# Patient Record
Sex: Female | Born: 2005 | Race: White | Hispanic: No | Marital: Single | Smoking: Current every day smoker
Health system: Southern US, Community
[De-identification: ages and names within clinical notes are randomized; demographics above are authoritative.]

## PROBLEM LIST (undated history)

## (undated) DIAGNOSIS — F909 Attention-deficit hyperactivity disorder, unspecified type: Secondary | ICD-10-CM

## (undated) DIAGNOSIS — F419 Anxiety disorder, unspecified: Secondary | ICD-10-CM

## (undated) DIAGNOSIS — F958 Other tic disorders: Secondary | ICD-10-CM

## (undated) DIAGNOSIS — R109 Unspecified abdominal pain: Secondary | ICD-10-CM

## (undated) DIAGNOSIS — F429 Obsessive-compulsive disorder, unspecified: Secondary | ICD-10-CM

## (undated) HISTORY — DX: Obsessive-compulsive disorder, unspecified: F42.9

## (undated) HISTORY — DX: Attention-deficit hyperactivity disorder, unspecified type: F90.9

## (undated) HISTORY — DX: Unspecified abdominal pain: R10.9

## (undated) HISTORY — DX: Anxiety disorder, unspecified: F41.9

## (undated) HISTORY — DX: Other tic disorders: F95.8

---
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
## 2023-03-24 NOTE — Progress Notes (Unsigned)
No chief complaint on file.  Patient is requesting to be screened for anemia. She has been feeling lightheaded and tired.   Her sister has h/o anemia with similar symptoms.  She hasn't been seen by me since her physical in 2022. CBC, lipids, chem and thyroid were all normal at that time. Vitamin D Deficiency was noted, with level of 16.9. She was treated with prescription weekly vitamin D, and was encouraged to take 1000 IU D3 daily, longterm, upon completion of the prescription.  She has had some sinus infections earlier this year (February and May 2024).   Cycles--regular? Heavy?  She has h/o dysmenorrhea, acne, PMDD, anxiety, OCD.  Under care of psych.  She was started on Yaz in 2022, only took x 4d, woke up with nightmare about throwing up, so stopped taking it. We had also discussed taking naproxen prior to cycles to help with pain.     PMH, PSH, SH reviewed   ROS:    PHYSICAL EXAM:  There were no vitals taken for this visit.      ASSESSMENT/PLAN:   Cbc, ferritin, D Consider TSH, c-met If sexually active, should also have STD check  Flu/COVID vaccines can be offered  Should schedule CPE

## 2023-03-25 ENCOUNTER — Ambulatory Visit: Payer: 59 | Admitting: Family Medicine

## 2023-03-25 ENCOUNTER — Encounter: Payer: Self-pay | Admitting: Family Medicine

## 2023-03-25 VITALS — BP 110/68 | HR 84 | Ht 69.0 in | Wt 128.4 lb

## 2023-03-25 DIAGNOSIS — R5383 Other fatigue: Secondary | ICD-10-CM

## 2023-03-25 DIAGNOSIS — F339 Major depressive disorder, recurrent, unspecified: Secondary | ICD-10-CM | POA: Diagnosis not present

## 2023-03-25 DIAGNOSIS — R42 Dizziness and giddiness: Secondary | ICD-10-CM | POA: Diagnosis not present

## 2023-03-25 DIAGNOSIS — Z8349 Family history of other endocrine, nutritional and metabolic diseases: Secondary | ICD-10-CM | POA: Diagnosis not present

## 2023-03-25 DIAGNOSIS — E559 Vitamin D deficiency, unspecified: Secondary | ICD-10-CM

## 2023-03-25 NOTE — Patient Instructions (Signed)
Try and eat more fruits and vegetables ( a high fiber diet)--this can help with moods, energy and constipation.  I suspect your vitamin D will be low, and we will send in another prescription.  Once you finish the prescription, it is very important that you take a daily supplement, long-term (D3 1000 IU daily, vs a multivitamin that contains the same amount).  Please stay well hydrated. Try and drink at least 6-8 glasses of water daily (goal is to have very pale yellow/clear urine--the more yellow it is, the more water you need to drink).  I agree with your plans to see the psychiatrist, as depression is likely contributing to some of your fatigue.  We will be in touch with your lab results within the next 1-2 days.  Please try and sign up for MyChart if you'd like the results sooner, and for easier ways to communicate with the office (sending messages with questions, rather than calling).

## 2023-03-26 ENCOUNTER — Encounter: Payer: Self-pay | Admitting: Family Medicine

## 2023-03-26 LAB — VITAMIN D 25 HYDROXY (VIT D DEFICIENCY, FRACTURES): Vit D, 25-Hydroxy: 11.8 ng/mL — ABNORMAL LOW (ref 30.0–100.0)

## 2023-03-26 LAB — CBC WITH DIFFERENTIAL/PLATELET
Basophils Absolute: 0 10*3/uL (ref 0.0–0.2)
Basos: 1 %
EOS (ABSOLUTE): 0.1 10*3/uL (ref 0.0–0.4)
Eos: 2 %
Hematocrit: 42.9 % (ref 34.0–46.6)
Hemoglobin: 13.8 g/dL (ref 11.1–15.9)
Immature Grans (Abs): 0 10*3/uL (ref 0.0–0.1)
Immature Granulocytes: 0 %
Lymphocytes Absolute: 2.2 10*3/uL (ref 0.7–3.1)
Lymphs: 36 %
MCH: 28.2 pg (ref 26.6–33.0)
MCHC: 32.2 g/dL (ref 31.5–35.7)
MCV: 88 fL (ref 79–97)
Monocytes Absolute: 0.5 10*3/uL (ref 0.1–0.9)
Monocytes: 7 %
Neutrophils Absolute: 3.3 10*3/uL (ref 1.4–7.0)
Neutrophils: 54 %
Platelets: 315 10*3/uL (ref 150–450)
RBC: 4.89 x10E6/uL (ref 3.77–5.28)
RDW: 12.7 % (ref 11.7–15.4)
WBC: 6.1 10*3/uL (ref 3.4–10.8)

## 2023-03-26 LAB — FERRITIN: Ferritin: 25 ng/mL (ref 15–77)

## 2023-03-26 LAB — TSH: TSH: 1.5 u[IU]/mL (ref 0.450–4.500)

## 2023-03-26 MED ORDER — VITAMIN D (ERGOCALCIFEROL) 1.25 MG (50000 UNIT) PO CAPS: 50000.0000 [IU] | ORAL_CAPSULE | ORAL | 0 refills | Status: AC

## 2023-06-15 ENCOUNTER — Other Ambulatory Visit: Payer: Self-pay | Admitting: Family Medicine

## 2023-06-15 DIAGNOSIS — E559 Vitamin D deficiency, unspecified: Secondary | ICD-10-CM

## 2023-11-24 ENCOUNTER — Telehealth: Admitting: Physician Assistant

## 2023-11-24 DIAGNOSIS — K5900 Constipation, unspecified: Secondary | ICD-10-CM | POA: Diagnosis not present

## 2023-11-24 MED ORDER — ONDANSETRON HCL 4 MG PO TABS: 4.0000 mg | ORAL_TABLET | Freq: Three times a day (TID) | ORAL | 0 refills | Status: AC | PRN

## 2023-11-24 NOTE — Patient Instructions (Signed)
  Shiniqua Gengler, thank you for joining Harlene PEDLAR Ward, PA-C for today's virtual visit.  While this provider is not your primary care provider (PCP), if your PCP is located in our provider database this encounter information will be shared with them immediately following your visit.   A [facility] MyChart account gives you access to today's visit and all your visits, tests, and labs performed at [facility]  click here if you don't have a [facility] MyChart account or go to [email or web address]  Consent: (Patient) Anne [name] ([place] provided verbal consent for this virtual visit at the beginning of the encounter.  Current Medications:  Current Outpatient Medications:    ondansetron  (ZOFRAN ) 4 MG tablet, Take 4 mg by mouth every 8 (eight) hours as needed for nausea or vomiting. (Patient not taking: Reported on 03/25/2023), Disp: , Rfl:    SRONYX 0.1-20 MG-MCG tablet, Take 1 tablet by mouth daily., Disp: , Rfl:    Vitamin D , Ergocalciferol , (DRISDOL ) 1.25 MG (50000 UNIT) CAPS capsule, Take 1 capsule (50,000 Units total) by mouth every 7 (seven) days., Disp: 12 capsule, Rfl: 0   Medications ordered in this encounter:  No orders of the defined types were placed in this encounter.    *If you need refills on other medications prior to your next appointment, please contact your pharmacy*  Follow-Up: Call back or seek an in-person evaluation if the symptoms worsen or if the condition fails to improve as anticipated.  [facility] [phone]  Other Instructions Recommend 1 scoop or capful of Miralax  in juice daily, can increase to twice per day if no improvement.   Can take Senakot 1-2 tablets.   Drink plenty of fluids.   Can continue with fiber gummies.   If no improvement recommend in person evaluation.  Recommend follow up with Primary Care Physician regardless since this is an ongoing problem.    If you have been instructed to have an in-person  evaluation today at a local Urgent Care facility, please use the link below. It will take you to a list of all of our available [facility], including address, phone number and hours of operation. Please do not delay care.  [facility]  If you or a family member do not have a primary care provider, use the link below to schedule a visit and establish care. When you choose a [facility] primary care physician or advanced practice provider, you gain a long-term partner in health. Find a Primary Care Provider  Learn more about [facility]'s in-office and virtual care options: [facility] - Get Care Now

## 2023-11-24 NOTE — Progress Notes (Signed)
 Virtual Visit Consent   Anne [name], you are scheduled for a virtual visit with a [facility] provider today. Just as with appointments in the office, your consent must be obtained to participate. Your consent will be active for this visit and any virtual visit you may have with one of our providers in the next 365 days. If you have a MyChart account, a copy of this consent can be sent to you electronically.  As this is a virtual visit, video technology does not allow for your provider to perform a traditional examination. This may limit your provider's ability to fully assess your condition. If your provider identifies any concerns that need to be evaluated in person or the need to arrange testing (such as labs, EKG, etc.), we will make arrangements to do so. Although advances in technology are sophisticated, we cannot ensure that it will always work on either your end or our end. If the connection with a video visit is poor, the visit may have to be switched to a telephone visit. With either a video or telephone visit, we are not always able to ensure that we have a secure connection.  By engaging in this virtual visit, you consent to the provision of healthcare and authorize for your insurance to be billed (if applicable) for the services provided during this visit. Depending on your insurance coverage, you may receive a charge related to this service.  I need to obtain your verbal consent now. Are you willing to proceed with your visit today? Anne [name] has provided verbal consent on 11/24/2023 for a virtual visit (video or telephone). Harlene PEDLAR Ward, PA-C  Date: 11/24/2023 12:40 PM   Virtual Visit via Video Note   I, Harlene PEDLAR Ward, connected with  Anne [name]  ([id number], April 17, 2006) on 11/24/23 at 12:15 PM EDT by a video-enabled telemedicine application and verified that I am speaking with the correct person using two identifiers.  Location: Patient: Virtual Visit  Location Patient: Home Provider: Virtual Visit Location Provider: Home Office   I discussed the limitations of evaluation and management by telemedicine and the availability of in person appointments. The patient expressed understanding and agreed to proceed.    History of Present Illness: Anne [name] is a 18 y.o. who identifies as a female who was assigned female at birth, and is being seen today for constipation.  She has been experiencing this intermittently for some time.  Reports last bowel movement about 5 days ago.  She has tried Miralax  and fiber with minimal relief.  Does complain of nausea and abdominal cramping.  Denies vomiting.  Drinking fluids.  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 (Patient not taking: Reported on 03/25/2023), Disp: , Rfl:    SRONYX 0.1-20 MG-MCG tablet, Take 1 tablet by mouth daily., Disp: , Rfl:    Vitamin D , Ergocalciferol , (DRISDOL ) 1.25 MG (50000 UNIT) CAPS capsule, Take 1 capsule (50,000 Units total) by mouth every 7 (seven) days., Disp: 12 capsule, Rfl: 0  Observations/Objective: Patient is well-developed, well-nourished in no acute distress.  Resting comfortably  at home.  Head is normocephalic, atraumatic.  No labored breathing.  Speech is clear and coherent with logical content.  Patient is alert and oriented at baseline.    Assessment and Plan: 1. Constipation, unspecified constipation type (Primary)  Supportive care discussed.  If no improvement recommend in person evaluation.    Follow Up Instructions: I discussed the assessment and treatment plan with the patient. The patient was provided an opportunity to ask questions and all were answered. The patient agreed with the plan and demonstrated an understanding of the instructions.  A copy of instructions were sent to the patient via MyChart unless otherwise noted below.     The patient was advised to call back or seek an in-person evaluation if the symptoms worsen or if the condition fails to improve as anticipated.    Harlene PEDLAR Ward, PA-C

## 2023-11-28 ENCOUNTER — Ambulatory Visit (HOSPITAL_COMMUNITY)

## 2023-11-28 ENCOUNTER — Ambulatory Visit: Admitting: Family Medicine

## 2023-11-28 ENCOUNTER — Ambulatory Visit
Admission: RE | Admit: 2023-11-28 | Discharge: 2023-11-28 | Disposition: A | Discharge status: Home / Self Care | Source: Ambulatory Visit | Attending: Family Medicine | Admitting: Family Medicine

## 2023-11-28 VITALS — BP 122/70 | HR 111 | Wt 117.0 lb

## 2023-11-28 DIAGNOSIS — K5900 Constipation, unspecified: Secondary | ICD-10-CM

## 2023-11-28 MED ORDER — POLYETHYLENE GLYCOL 3350 17 GM/SCOOP PO POWD: 17.0000 g | Freq: Two times a day (BID) | ORAL | 1 refills | Status: AC | PRN

## 2023-11-28 NOTE — Progress Notes (Signed)
 Name: Anne [name]   Date of Visit: 11/28/23   Date of last visit with me: Visit date not found   CHIEF COMPLAINT:  Chief Complaint  Patient presents with   Acute Visit    Constipation issues. Been going on for the past 2 weeks. Took mirlax for 3 days now bowel movements are diarrhea like, has a lot of abdominal pain which is making her nausea. Also thinks she could have a conditions which is causing her to be constipation.        HPI:  Discussed the use of AI scribe software for clinical note transcription with the patient, who gave verbal consent to proceed.  History of Present Illness   Anne [name] is a 18 year old female with chronic constipation who presents with worsening constipation over the past two weeks. She is accompanied by her mother, Anne [name].  She has experienced chronic constipation with a recent exacerbation over the past two weeks, characterized by abdominal pain, nausea, and a sensation of incomplete evacuation. Bowel movements require significant straining, resulting in small, hard stools, and she often feels unable to completely evacuate her bowels.  She has been taking Miralax  daily for the past four days, which has led to watery stools. Prior to this, her stools were very small and hard. She has not used any other laxatives or fiber supplements like Metamucil. Her fluid intake is limited to two or three 8-ounce bottles of water per day. She acknowledges a poor diet and gets full quickly, which may contribute to her constipation.  She is currently taking Diannavel, which she noted can cause constipation, but she only takes a half dose. During the review of symptoms, she reported bloating and a 'bubbling' sensation in her gut. She also mentioned a fear of nausea due to a fear of vomiting, which has led to panic attacks. She notes a firm feeling in her abdomen.         OBJECTIVE:       03/25/2023    4:24 PM  Depression screen PHQ 2/9  Decreased  Interest 2  Down, Depressed, Hopeless 2  PHQ - 2 Score 4  Altered sleeping 2  Change in appetite 3  Feeling bad or failure about yourself  2  Trouble concentrating 1  Moving slowly or fidgety/restless 1  Suicidal thoughts 0  PHQ-9 Score 13     BP Readings from Last 3 Encounters:  11/28/23 122/70  03/25/23 110/68  10/20/20 (!) 100/62 (11%, Z = -1.23 /  27%, Z = -0.61)*   *BP percentiles are based on the 2017 AAP Clinical Practice Guideline for girls    BP 122/70   Pulse (!) 111   Wt 117 lb (53.1 kg)   SpO2 99%   BMI 17.28 kg/m    Physical Exam   ABDOMEN: Abdomen firm, non-tender, with pain mostly in the lower area.      Physical Exam Abdominal:     General: Abdomen is flat. There is no distension.     Palpations: Abdomen is soft. There is no mass.     Tenderness: There is abdominal tenderness (Mild generalized abdominal tenderness in all 4 quadrants more pronounced in the lower quadrants). There is no rebound.     Hernia: No hernia is present.     ASSESSMENT/PLAN:   Assessment & Plan Constipation, unspecified constipation type    Assessment and Plan    Constipation with associated abdominal pain and nausea Chronic constipation exacerbated by insufficient fluid intake, causing  abdominal pain and nausea. Miralax  causing diarrhea due to low water intake. Likely functional constipation due to dietary habits. - Increase water intake to 60 ounces daily. - Continue Miralax  twice daily for two days, then reduce to once daily for a week. - Transition to Metamucil for maintenance after symptom resolution. - Order abdominal x-ray to assess constipation extent. - Consider magnesium citrate if no improvement. - Discuss potential enema if symptoms persist. - Send Miralax  prescription to [facility] on [address].         Anne [name] A. Vita MD [facility]

## 2023-12-02 ENCOUNTER — Ambulatory Visit: Payer: Self-pay | Admitting: Family Medicine

## 2024-01-09 ENCOUNTER — Telehealth: Admitting: Nurse Practitioner

## 2024-01-09 DIAGNOSIS — F339 Major depressive disorder, recurrent, unspecified: Secondary | ICD-10-CM

## 2024-01-09 DIAGNOSIS — F419 Anxiety disorder, unspecified: Secondary | ICD-10-CM | POA: Diagnosis not present

## 2024-01-09 DIAGNOSIS — F900 Attention-deficit hyperactivity disorder, predominantly inattentive type: Secondary | ICD-10-CM | POA: Diagnosis not present

## 2024-01-09 NOTE — Assessment & Plan Note (Addendum)
 Continue Dyanavel Xr 10 mg PO QAM

## 2024-01-09 NOTE — Progress Notes (Signed)
   Subjective:  No chief complaint on file.    HPI: Anne [name] is a 18 y.o. female presenting on 01/09/2024 via telehealth for her psychiatry follow up. She is a previous patient of this provider at previous practice.  She reports things are good and school is going well. 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 She is taking 3 classes right now. She reports she is doing well on her ADHD medications with continued improvement with focus, concentration, attention span and staying on task without being easily distracted. She is also working 2 jobs working at [employer] and [employer]. Patient has a history depression but reports she is doing good and denies any depressive symptoms at this time and no longer taking her Celexa. She denies poor appetite, sleep issues, adverse reaction to her medications, psychosis, delusions, suicidal or homicidal ideations.  ROS: Negative unless specifically indicated above in HPI.   Relevant past medical history reviewed and updated as indicated.   Allergies and medications reviewed and updated.   Current Outpatient Medications  Medication Instructions   DYANAVEL XR 10 MG TBCR 1 tablet, Oral, Every morning, Takes 1/2 of medication for a whole day so 5 mg    ondansetron  (ZOFRAN ) 4 mg, Oral, Every 8 hours PRN   polyethylene glycol powder (GLYCOLAX /MIRALAX ) 17 g, Oral, 2 times daily PRN   SRONYX 0.1-20 MG-MCG tablet 1 tablet, Oral, Daily   Vitamin D  (Ergocalciferol ) (DRISDOL ) 50,000 Units, Oral, Every 7 days     No Known Allergies  Objective:   There were no vitals taken for this visit.   Physical Exam Constitutional:      Appearance: Normal appearance.  Neurological:     General: No focal deficit present.     Mental Status: She is alert and oriented to person, place, and time.  Psychiatric:        Attention and Perception: Attention and perception normal.        Mood and Affect: Mood normal. Mood is  not anxious or depressed.        Speech: Speech normal.        Behavior: Behavior normal. Behavior is not agitated, aggressive or hyperactive. Behavior is cooperative.        Thought Content: Thought content normal. Thought content is not paranoid or delusional. Thought content does not include homicidal or suicidal ideation.        Cognition and Memory: Cognition and memory normal.        Judgment: Judgment normal.    Assessment & Plan:   Assessment & Plan Attention deficit hyperactivity disorder (ADHD), predominantly inattentive type Continue Dyanavel Xr 10 mg PO QAM     Recurrent major depressive disorder, remission status unspecified (HCC) Managed well with CBT    Anxiety Managed well with CBT       Follow up plan: Return in about 2 months (around 03/10/2024).  Florencia Cousin, NP

## 2024-01-09 NOTE — Assessment & Plan Note (Signed)
 Managed well with CBT

## 2024-02-11 ENCOUNTER — Telehealth: Admitting: Nurse Practitioner

## 2024-04-28 ENCOUNTER — Telehealth: Admitting: Nurse Practitioner

## 2024-04-28 DIAGNOSIS — F419 Anxiety disorder, unspecified: Secondary | ICD-10-CM | POA: Diagnosis not present

## 2024-04-28 DIAGNOSIS — F3342 Major depressive disorder, recurrent, in full remission: Secondary | ICD-10-CM | POA: Diagnosis not present

## 2024-04-28 DIAGNOSIS — F900 Attention-deficit hyperactivity disorder, predominantly inattentive type: Secondary | ICD-10-CM | POA: Diagnosis not present

## 2024-04-28 NOTE — Progress Notes (Signed)
" ° °  Subjective:  I'm doing good.    HPI: Anne [name] is a 19 y.o. female presenting on 04/28/2024 via telehealth for medication follow up.  Patient reports she is doing good. She reports she was sick over the Christmas holiday but doing much better now. She is still at [employer] in [place] majoring in [employer] and received all A's in her classes last semester. She reports continued improvement with focus, concentration and attention span on her current ADHD medication and dose. She reports she only takes in during the week when she has classes and takes a break from it on the weekends and during the holidays. She reports she is managing her anxiety and depression without medication at this time and currently stable.  She denies poor appetite, sleep issues, adverse reaction to her medication psychosis, delusions, suicidal or homicidal ideations.   ROS: Negative unless specifically indicated above in HPI.   Relevant past medical history reviewed and updated as indicated.   Allergies and medications reviewed and updated.   Current Outpatient Medications  Medication Instructions   DYANAVEL XR 10 MG TBCR 1 tablet, Oral, Every morning, Takes 1/2 of medication for a whole day so 5 mg    ondansetron  (ZOFRAN ) 4 mg, Oral, Every 8 hours PRN   polyethylene glycol powder (GLYCOLAX /MIRALAX ) 17 g, Oral, 2 times daily PRN   SRONYX 0.1-20 MG-MCG tablet 1 tablet, Oral, Daily   Vitamin D  (Ergocalciferol ) (DRISDOL ) 50,000 Units, Oral, Every 7 days     Allergies[1]  Objective:   There were no vitals taken for this visit.   Physical Exam Constitutional:      Appearance: Normal appearance.  HENT:     Head: Normocephalic.  Eyes:     Conjunctiva/sclera: Conjunctivae normal.  Neurological:     General: No focal deficit present.     Mental Status: She is alert and oriented to person, place, and time.  Psychiatric:        Attention and Perception: Attention and perception normal.        Mood  and Affect: Mood and affect normal.        Speech: Speech normal.        Behavior: Behavior normal.        Thought Content: Thought content normal.        Cognition and Memory: Cognition and memory normal.        Judgment: Judgment normal.    Assessment & Plan:   Assessment & Plan Attention deficit hyperactivity disorder (ADHD), predominantly inattentive type     Recurrent major depressive disorder, in full remission     Anxiety    Continue Dyanavel Xr 10 mg po qam for ADHD Patient is currently managing her anxiety/depression w/out medication PDMP Reviewed: 01/13/2024 Dyanavel Xr 10 Mg Tablet Qty: 30   Follow up plan: Return in about 2 months (around 06/26/2024) for Medication Follow-up.  Florencia Cousin, NP      [1] No Known Allergies  "

## 2024-04-30 ENCOUNTER — Encounter: Payer: Self-pay | Admitting: Nurse Practitioner

## 2024-04-30 NOTE — Assessment & Plan Note (Signed)
" °  °  Continue Dyanavel Xr 10 mg po qam for ADHD Patient is currently managing her anxiety/depression w/out medication "

## 2024-04-30 NOTE — Assessment & Plan Note (Signed)
 SABRA

## 2024-04-30 NOTE — Assessment & Plan Note (Signed)
 Anne [name]

## 2024-06-23 ENCOUNTER — Telehealth: Admitting: Nurse Practitioner
